# Patient Record
Sex: Male | Born: 1985 | Race: White | Hispanic: No | Marital: Single | Smoking: Current every day smoker
Health system: Southern US, Community
[De-identification: ages and names within clinical notes are randomized; demographics above are authoritative.]

## PROBLEM LIST (undated history)

## (undated) DIAGNOSIS — F988 Other specified behavioral and emotional disorders with onset usually occurring in childhood and adolescence: Secondary | ICD-10-CM

---
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
## 2018-05-03 ENCOUNTER — Emergency Department (HOSPITAL_COMMUNITY): Payer: Self-pay

## 2018-05-03 ENCOUNTER — Emergency Department (HOSPITAL_COMMUNITY)
Admission: EM | Admit: 2018-05-03 | Discharge: 2018-05-03 | Disposition: A | Discharge status: Home / Self Care | Payer: Self-pay | Attending: Emergency Medicine | Admitting: Emergency Medicine

## 2018-05-03 ENCOUNTER — Other Ambulatory Visit: Payer: Self-pay

## 2018-05-03 ENCOUNTER — Encounter (HOSPITAL_COMMUNITY): Payer: Self-pay | Admitting: Emergency Medicine

## 2018-05-03 DIAGNOSIS — L03011 Cellulitis of right finger: Secondary | ICD-10-CM | POA: Insufficient documentation

## 2018-05-03 DIAGNOSIS — F17210 Nicotine dependence, cigarettes, uncomplicated: Secondary | ICD-10-CM | POA: Insufficient documentation

## 2018-05-03 MED ORDER — HYDROCODONE-ACETAMINOPHEN 5-325 MG PO TABS: ORAL_TABLET | ORAL | 0 refills | Status: AC

## 2018-05-03 MED ORDER — DOXYCYCLINE HYCLATE 100 MG PO CAPS: 100.0000 mg | ORAL_CAPSULE | Freq: Two times a day (BID) | ORAL | 0 refills | Status: AC

## 2018-05-03 MED ORDER — POVIDONE-IODINE 10 % EX SOLN: CUTANEOUS | Status: DC | PRN

## 2018-05-03 MED ORDER — LIDOCAINE HCL (PF) 2 % IJ SOLN: 5.0000 mL | Freq: Once | INTRAMUSCULAR | Status: DC

## 2018-05-03 MED ORDER — LIDOCAINE HCL (PF) 2 % IJ SOLN
INTRAMUSCULAR | Status: AC
  Filled 2018-05-03: qty 10

## 2018-05-03 MED ORDER — OXYCODONE-ACETAMINOPHEN 5-325 MG PO TABS
1.0000 | ORAL_TABLET | Freq: Once | ORAL | Status: AC
  Administered 2018-05-03: 1 via ORAL
  Filled 2018-05-03: qty 1

## 2018-05-03 MED ORDER — DOXYCYCLINE HYCLATE 100 MG PO TABS
100.0000 mg | ORAL_TABLET | Freq: Once | ORAL | Status: AC
  Administered 2018-05-03: 100 mg via ORAL
  Filled 2018-05-03: qty 1

## 2018-05-03 NOTE — ED Provider Notes (Signed)
[facility] EMERGENCY DEPARTMENT Provider Note   CSN: [id number] Arrival date & time: 05/03/18  1351     History   Chief Complaint Chief Complaint  Patient presents with  . Hand Pain    HPI Roberto Hayes is a 32 y.o. male.  HPI   Roberto Hayes is a 32 y.o. male who presents to the Emergency Department complaining of pain and swelling of the distal end of his right middle finger.  He states that he was working on an automobile 4 days ago and suffered a puncture to the distal end of the finger by a piece of wire.  He has noticed progression of his symptoms since that time.  He describes an intense, throbbing pain to his distal finger that is constant and worse with extension of his finger.  He has tried warm water soaks without relief.  He denies pain into his palm or wrist.  No fever, chills or drainage.  He denies nail biting.   History reviewed. No pertinent past medical history.  There are no active problems to display for this patient.   History reviewed. No pertinent surgical history.    Home Medications    Prior to Admission medications   Not on File    Family History History reviewed. No pertinent family history.  Social History Social History   Tobacco Use  . Smoking status: Current Every Day Smoker    Packs/day: 1.00    Types: Cigarettes  . Smokeless tobacco: Never Used  Substance Use Topics  . Alcohol use: Not Currently  . Drug use: Not Currently     Allergies   Patient has no known allergies.   Review of Systems Review of Systems  Constitutional: Negative for chills and fever.  Musculoskeletal: Positive for arthralgias (Right middle finger pain and swelling). Negative for joint swelling.  Skin: Negative for color change and wound.  Neurological: Negative for weakness and numbness.     Physical Exam Updated Vital Signs BP (!) 112/91 (BP Location: Left Arm)   Pulse (!) 114   Temp 97.6 F (36.4 C) (Oral)   Resp 16   Ht 5\' 9"  (1.753 m)    Wt 70.3 kg   SpO2 98%   BMI 22.89 kg/m   Physical Exam  Constitutional: He appears well-developed and well-nourished.  Patient is tearful and anxious appearing.  HENT:  Head: Normocephalic and atraumatic.  Cardiovascular: Normal rate, regular rhythm and intact distal pulses.  Pulmonary/Chest: Effort normal and breath sounds normal. No respiratory distress.  Musculoskeletal: He exhibits edema and tenderness.       Right hand: He exhibits tenderness and swelling. He exhibits normal capillary refill. Normal sensation noted.       Hands: Focal edema and erythema of the fat pad of the distal right middle finger.  Erythema extends dorsally.  No drainage or swelling along the epionychium  Neurological: He is alert. No sensory deficit.  Skin: Skin is warm. Capillary refill takes less than 2 seconds.  Nursing note and vitals reviewed.          ED Treatments / Results  Labs (all labs ordered are listed, but only abnormal results are displayed) Labs Reviewed - No data to display  EKG None  Radiology Dg Finger Middle Right  Result Date: 05/03/2018 CLINICAL DATA:  Three day history of right middle finger pain swelling and erythema with no known injury. EXAM: RIGHT MIDDLE FINGER 2+V COMPARISON:  None. FINDINGS: The bones are subjectively adequately mineralized. The joint spaces  are well maintained. There is no acute or healing fracture. There is no periosteal reaction. There are no abnormal soft tissue calcifications nor gas collections. IMPRESSION: There is no acute or significant chronic bony abnormality of the right third or long finger. There is mild soft tissue swelling which may reflect edema or cellulitis. Electronically Signed   By: David  [place] M.D.   On: 05/03/2018 14:35    Procedures Procedures (including critical care time)  Medications Ordered in ED Medications  oxyCODONE-acetaminophen (PERCOCET/ROXICET) 5-325 MG per tablet 1 tablet (has no administration in time range)       Initial Impression / Assessment and Plan / ED Course  I have reviewed the triage vital signs and the nursing notes.  Pertinent labs & imaging results that were available during my care of the patient were reviewed by me and considered in my medical decision making (see chart for details).     1555  Consulted Dr. Izora Ribas regarding findings.  Recommends I&D and recheck in 2 days   I&D performed by Dr. Eber Hong (see his note for procedure)  Finger bandaged.  Pt agrees to wound care instructions and ER return in 2 days for recheck and packing removal.  Rx for doxycycline.    Final Clinical Impressions(s) / ED Diagnoses   Final diagnoses:  Felon of finger of right hand    ED Discharge Orders    None       Pauline Aus, PA-C 05/03/18 1724    Eber Hong, MD 05/03/18 2322

## 2018-05-03 NOTE — ED Notes (Signed)
Dr Judie Petit and TT in to assess and repair

## 2018-05-03 NOTE — ED Notes (Signed)
From Rad 

## 2018-05-03 NOTE — ED Provider Notes (Signed)
Medical screening examination/treatment/procedure(s) were conducted as a shared visit with non-physician practitioner(s) and myself.  I personally evaluated the patient during the encounter.  Clinical Impression:   Final diagnoses:  Felon of finger of right hand    The patient is a 32 year old male who presents with a right middle finger swollen digit, it is swollen in the distal fat pad, he states that he had a puncture wound while he was working on his car 4 or 5 days ago and since that time is had progressive swelling and pain.  On exam the patient does have a swollen distal phalanx consistent with a felon, will need digital block and drainage, anabiotic's, otherwise he is well-appearing.  Normal range of motion of all other joints of the hand.  I personally performed the incision and drainage, see note below.  INCISION AND DRAINAGE Performed by: Vida Roller Consent: Verbal consent obtained. Risks and benefits: risks, benefits and alternatives were discussed Type: abscess  Body area: R middle finger  Anesthesia: local infiltration - digital block  Incision was made with a scalpel.  Local anesthetic: lidocaine 1% without epinephrine  Anesthetic total: 5 ml  Complexity: complex Blunt dissection to break up loculations  Drainage: purulent  Drainage amount: small  Packing material: 1/4 in iodoform gauze  Patient tolerance: Patient tolerated the procedure well with no immediate complications.    The patient will return within 48 hours for recheck and packing removal.  Started on doxycycline for home   Eber Hong, MD 05/03/18 2322

## 2018-05-03 NOTE — ED Triage Notes (Signed)
Pt reports R middle finger pain and swelling X3-4 days, denies drainage.

## 2018-05-03 NOTE — Discharge Instructions (Addendum)
Keep the finger clean and bandaged.  Take the antibiotic as directed.  Keeping your hand elevated may help with pain.  Return here on Sunday for recheck and packing removal.

## 2018-05-03 NOTE — ED Notes (Signed)
Middle finger reddened and swollen

## 2018-10-10 ENCOUNTER — Encounter (HOSPITAL_COMMUNITY): Payer: Self-pay | Admitting: Emergency Medicine

## 2018-10-10 ENCOUNTER — Emergency Department (HOSPITAL_COMMUNITY)
Admission: EM | Admit: 2018-10-10 | Discharge: 2018-10-10 | Disposition: A | Discharge status: Home / Self Care | Payer: Self-pay | Attending: Emergency Medicine | Admitting: Emergency Medicine

## 2018-10-10 ENCOUNTER — Other Ambulatory Visit: Payer: Self-pay

## 2018-10-10 DIAGNOSIS — F17210 Nicotine dependence, cigarettes, uncomplicated: Secondary | ICD-10-CM | POA: Insufficient documentation

## 2018-10-10 DIAGNOSIS — L02415 Cutaneous abscess of right lower limb: Secondary | ICD-10-CM | POA: Insufficient documentation

## 2018-10-10 HISTORY — DX: Other specified behavioral and emotional disorders with onset usually occurring in childhood and adolescence: F98.8

## 2018-10-10 MED ORDER — DOXYCYCLINE HYCLATE 100 MG PO CAPS: 100.0000 mg | ORAL_CAPSULE | Freq: Two times a day (BID) | ORAL | 0 refills | Status: AC

## 2018-10-10 MED ORDER — MUPIROCIN CALCIUM 2 % NA OINT: TOPICAL_OINTMENT | NASAL | 0 refills | Status: AC

## 2018-10-10 NOTE — ED Triage Notes (Signed)
Pt reports 2 insect bites that he feels are spider bites to his left wrist and right lateral hip x 3 days

## 2018-10-10 NOTE — Discharge Instructions (Signed)
Soak lesions as directed.  Clean the bathtub thoroughly as discussed.  Take antibiotics for 1 week.  Use antibiotic ointment in the nares for 1 week.  If you develop large firm abscesses they will need to be drained with scalpel.  Follow-up closely with primary doctor

## 2018-10-10 NOTE — ED Provider Notes (Signed)
[facility] EMERGENCY DEPARTMENT Provider Note   CSN: [id number] Arrival date & time: 10/10/18  1607     History   Chief Complaint Chief Complaint  Patient presents with  . Insect Bite    HPI Roberto Hayes is a 33 y.o. male.  Patient with no significant medical history presents with possible insect bite.  Patient has a lesion left distal wrist and right thigh worsening for the past few days.  No fevers or chills or vomiting.  No history of MRSA.  No witnessed insect bite.  Mild pain to palpation     Past Medical History:  Diagnosis Date  . ADD (attention deficit disorder) 2003    There are no active problems to display for this patient.   History reviewed. No pertinent surgical history.      Home Medications    Prior to Admission medications   Medication Sig Start Date End Date Taking? Authorizing Provider  doxycycline (VIBRAMYCIN) 100 MG capsule Take 1 capsule (100 mg total) by mouth 2 (two) times daily. 05/03/18   Triplett, Tammy, PA-C  doxycycline (VIBRAMYCIN) 100 MG capsule Take 1 capsule (100 mg total) by mouth 2 (two) times daily. One po bid x 7 days 10/10/18   Blane Ohara, MD  HYDROcodone-acetaminophen (NORCO/VICODIN) 5-325 MG tablet Take one tab po q 4 hrs prn pain 05/03/18   Triplett, Tammy, PA-C  mupirocin nasal ointment (BACTROBAN) 2 % Apply in each nostril daily for 1 week 10/10/18   Blane Ohara, MD    Family History History reviewed. No pertinent family history.  Social History Social History   Tobacco Use  . Smoking status: Current Every Day Smoker    Packs/day: 1.00    Types: Cigarettes  . Smokeless tobacco: Never Used  Substance Use Topics  . Alcohol use: Yes    Comment: rarely  . Drug use: Yes    Types: Marijuana    Comment: once per month     Allergies   Patient has no known allergies.   Review of Systems Review of Systems  Constitutional: Negative for chills and fever.  Respiratory: Negative for shortness of breath.     Cardiovascular: Negative for chest pain.  Gastrointestinal: Negative for abdominal pain and vomiting.  Musculoskeletal: Negative for back pain, neck pain and neck stiffness.  Skin: Positive for rash.  Neurological: Negative for light-headedness and headaches.     Physical Exam Updated Vital Signs BP (!) 151/92 (BP Location: Right Arm)   Pulse (!) 115   Temp 98.1 F (36.7 C) (Oral)   Resp 17   Ht 5\' 9"  (1.753 m)   Wt 72.6 kg   SpO2 98%   BMI 23.63 kg/m   Physical Exam Vitals signs and nursing note reviewed.  Constitutional:      Appearance: He is well-developed.  HENT:     Head: Normocephalic and atraumatic.  Eyes:     General:        Right eye: No discharge.        Left eye: No discharge.     Conjunctiva/sclera: Conjunctivae normal.  Neck:     Musculoskeletal: Normal range of motion and neck supple.     Trachea: No tracheal deviation.  Cardiovascular:     Rate and Rhythm: Normal rate.  Pulmonary:     Effort: Pulmonary effort is normal.  Skin:    General: Skin is warm.     Findings: Rash present.     Comments: Patient has 1.5 cm of mild induration erythema  right proximal thigh with mild purulence with pressure.  Patient has 1 cm erythema area distal left wrist without fluctuance or induration.  No involvement of the joints.  No streaking erythema.  Neurological:     Mental Status: He is alert and oriented to person, place, and time.      ED Treatments / Results  Labs (all labs ordered are listed, but only abnormal results are displayed) Labs Reviewed - No data to display  EKG None  Radiology No results found.  Procedures Procedures (including critical care time)  Medications Ordered in ED Medications - No data to display   Initial Impression / Assessment and Plan / ED Course  I have reviewed the triage vital signs and the nursing notes.  Pertinent labs & imaging results that were available during my care of the patient were reviewed by me and  considered in my medical decision making (see chart for details).    Patient presents with clinical concern for MRSA and 2 early abscesses.  One is draining with pressure the other ones not indurated.  Discussed reasons to return and plan for Doxy at this time.  No indication for scalpel currently.  Final Clinical Impressions(s) / ED Diagnoses   Final diagnoses:  Abscess of right thigh    ED Discharge Orders         Ordered    doxycycline (VIBRAMYCIN) 100 MG capsule  2 times daily     10/10/18 1104    mupirocin nasal ointment (BACTROBAN) 2 %     10/10/18 1104           Blane Ohara, MD 10/10/18 1106

## 2019-04-22 IMAGING — DX DG FINGER MIDDLE 2+V*R*
3 series · 3 of 3 positions shown · non-contrast
Comparison: None.

CLINICAL DATA: Three day history of right middle finger pain
swelling and erythema with no known injury.

EXAM:
RIGHT MIDDLE FINGER 2+V

[finger ap]
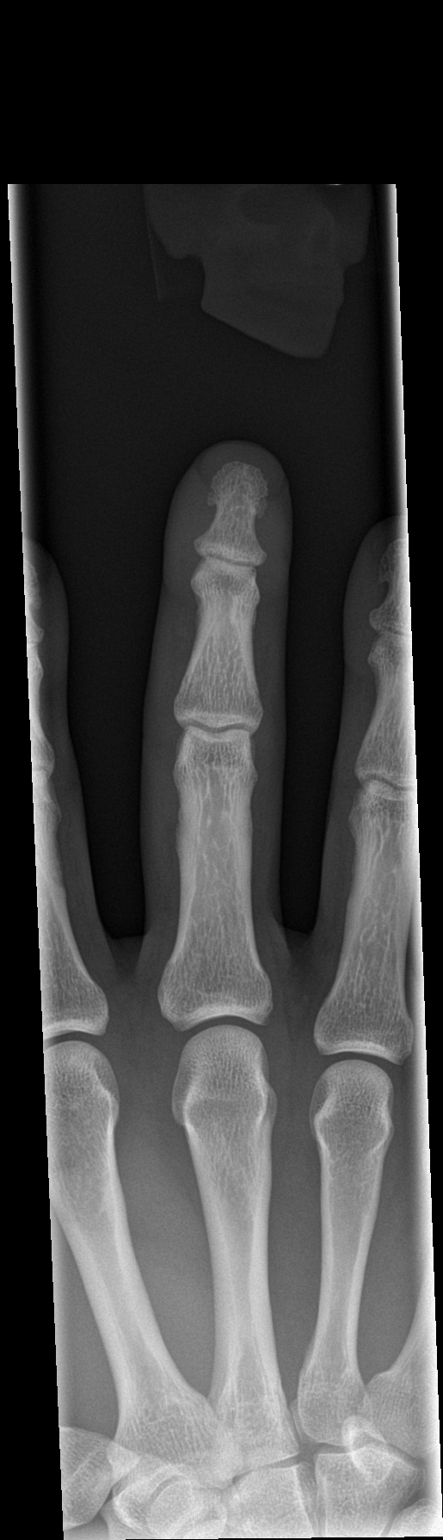

[finger obl]
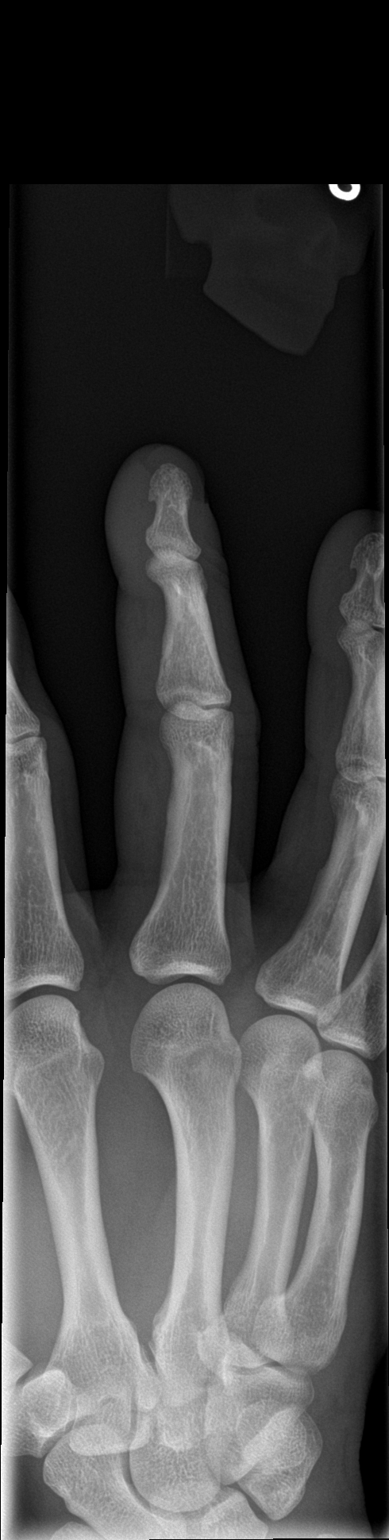

[finger lat]
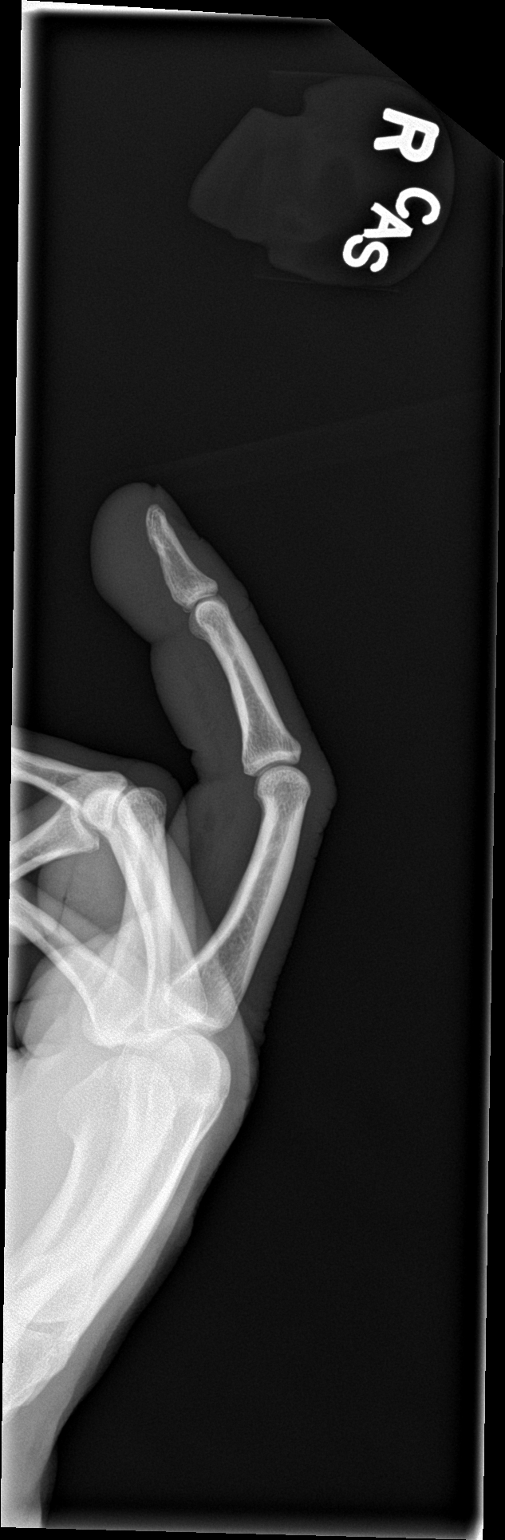

[3 of 3 positions shown; findings below may reference images not displayed]

FINDINGS: The bones are subjectively adequately mineralized. The joint spaces
are well maintained. There is no acute or healing fracture. There is
no periosteal reaction. There are no abnormal soft tissue
calcifications nor gas collections.
IMPRESSION: There is no acute or significant chronic bony abnormality of the
right third or long finger. There is mild soft tissue swelling which
may reflect edema or cellulitis.
